# Patient Record
Sex: Male | Born: 1960 | Race: Black or African American | Hispanic: No | Marital: Married | Smoking: Never smoker
Health system: Southern US, Community
[De-identification: ages and names within clinical notes are randomized; demographics above are authoritative.]

## PROBLEM LIST (undated history)

## (undated) DIAGNOSIS — G4730 Sleep apnea, unspecified: Secondary | ICD-10-CM

## (undated) DIAGNOSIS — I10 Essential (primary) hypertension: Secondary | ICD-10-CM

## (undated) DIAGNOSIS — R0683 Snoring: Secondary | ICD-10-CM

## (undated) HISTORY — DX: Snoring: R06.83

## (undated) HISTORY — DX: Essential (primary) hypertension: I10

## (undated) HISTORY — DX: Sleep apnea, unspecified: G47.30

---

## 1997-11-23 ENCOUNTER — Emergency Department (HOSPITAL_COMMUNITY): Admission: EM | Admit: 1997-11-23 | Discharge: 1997-11-23 | Payer: Self-pay | Admitting: Emergency Medicine

## 2004-08-07 DIAGNOSIS — I10 Essential (primary) hypertension: Secondary | ICD-10-CM

## 2004-08-07 HISTORY — DX: Essential (primary) hypertension: I10

## 2010-08-18 ENCOUNTER — Emergency Department (HOSPITAL_COMMUNITY)
Admission: EM | Admit: 2010-08-18 | Discharge: 2010-08-18 | Payer: Self-pay | Source: Home / Self Care | Admitting: Emergency Medicine

## 2010-08-22 LAB — CK TOTAL AND CKMB (NOT AT ARMC)
CK, MB: 8.3 ng/mL (ref 0.3–4.0)
Relative Index: 0.9 (ref 0.0–2.5)
Total CK: 957 U/L — ABNORMAL HIGH (ref 7–232)

## 2010-08-22 LAB — TROPONIN I: Troponin I: 0.02 ng/mL (ref 0.00–0.06)

## 2010-09-01 ENCOUNTER — Ambulatory Visit (HOSPITAL_COMMUNITY)
Admission: RE | Admit: 2010-09-01 | Discharge: 2010-09-01 | Payer: Self-pay | Source: Home / Self Care | Attending: Internal Medicine | Admitting: Internal Medicine

## 2012-01-10 ENCOUNTER — Telehealth: Payer: Self-pay | Admitting: Gastroenterology

## 2012-01-10 NOTE — Telephone Encounter (Signed)
Pt was not scheduled. He had been referred by Dr. Sherwood Gambler and had receieved a letter from [place] to cal to schedule. I noted and faxed letter to PCP.

## 2012-01-10 NOTE — Telephone Encounter (Signed)
Pt called to cancel procedure and is going to have it done in [place].

## 2012-01-15 IMAGING — CR DG CHEST 2V
2 series · 2 of 2 positions shown · non-contrast
Comparison: 08/18/2010

CLINICAL DATA: Chest pain, mid chest to right side

CHEST - 2 VIEW

[view not recorded (1 of 2)]
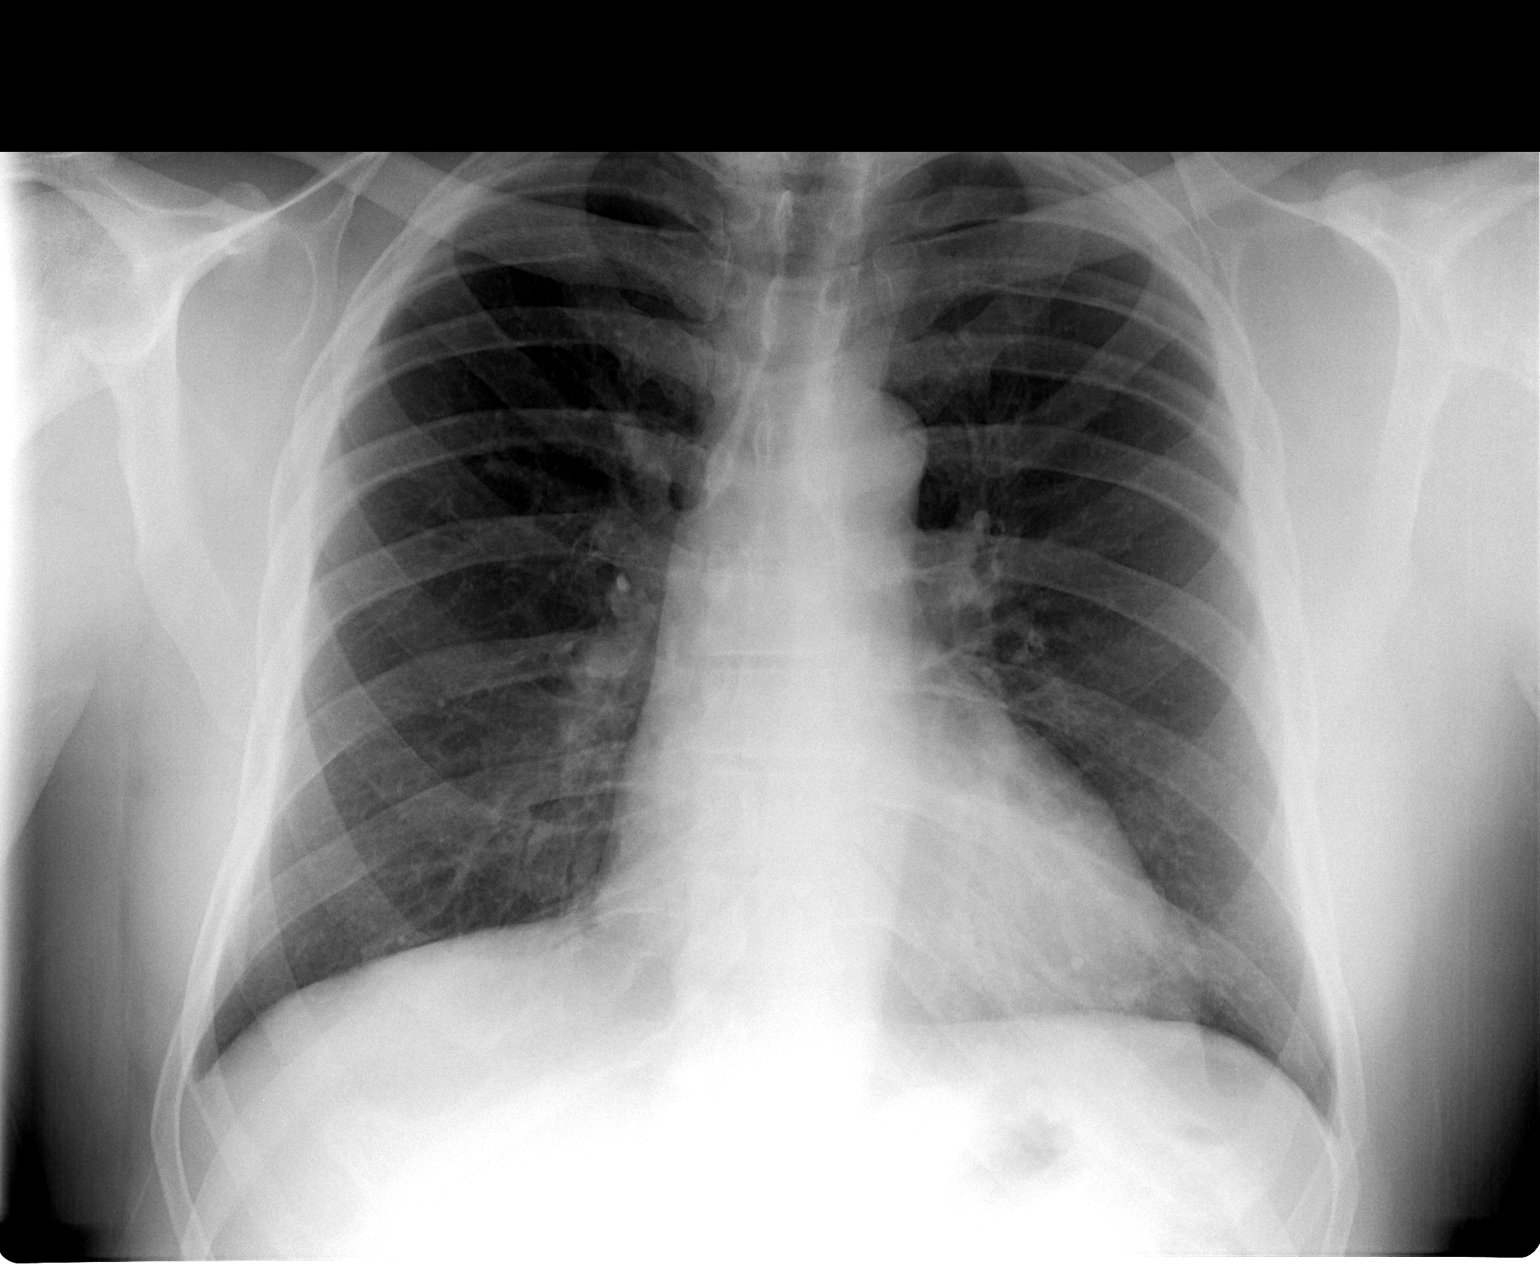

[view not recorded (2 of 2)]
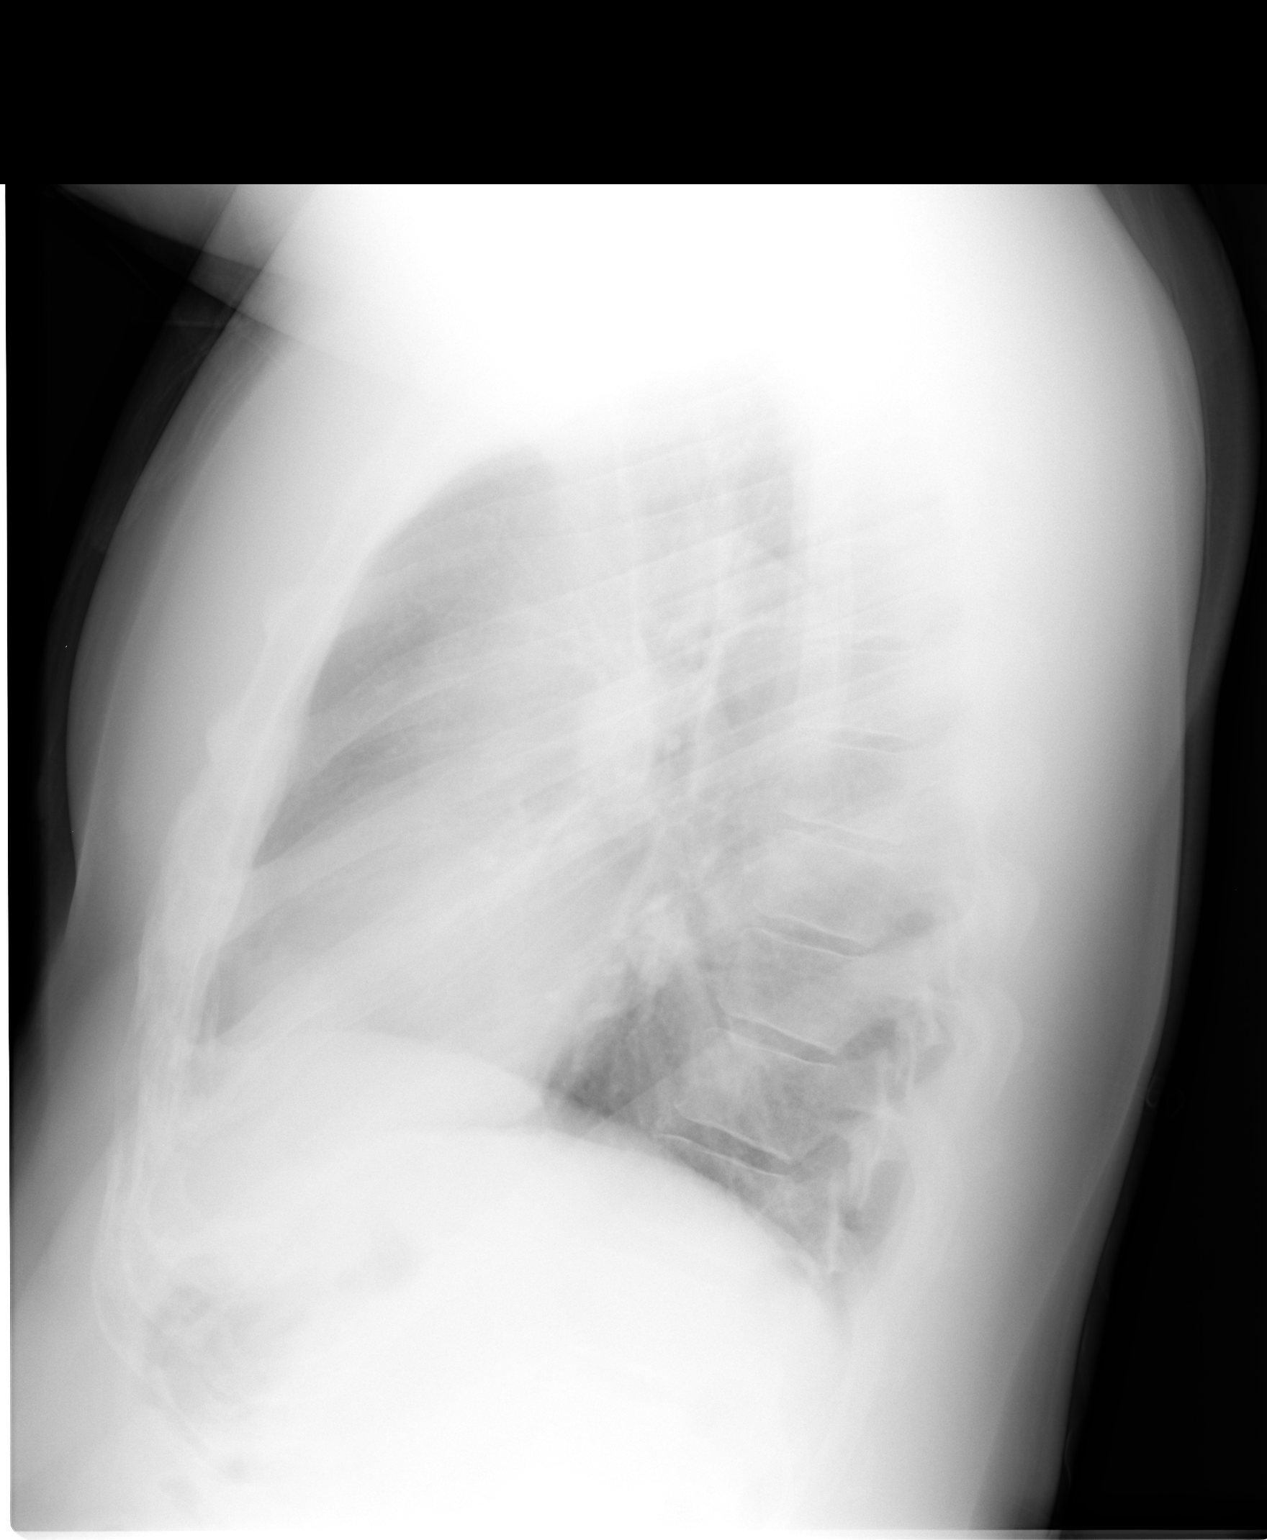

[2 of 2 positions shown; findings below may reference images not displayed]

FINDINGS: Borderline enlargement of cardiac silhouette.
Mediastinal contours and pulmonary vascularity normal.
Lungs clear.
No pleural effusion or pneumothorax.
No significant osseous abnormalities.
IMPRESSION: Borderline enlargement of cardiac silhouette.
No acute abnormalities.

## 2012-01-16 ENCOUNTER — Encounter: Payer: Self-pay | Admitting: Gastroenterology

## 2012-02-20 ENCOUNTER — Encounter: Payer: Self-pay | Admitting: Gastroenterology

## 2012-02-21 ENCOUNTER — Ambulatory Visit (AMBULATORY_SURGERY_CENTER): Payer: PRIVATE HEALTH INSURANCE

## 2012-02-21 VITALS — Ht 68.0 in | Wt 215.0 lb

## 2012-02-21 DIAGNOSIS — Z1211 Encounter for screening for malignant neoplasm of colon: Secondary | ICD-10-CM

## 2012-02-21 MED ORDER — MOVIPREP 100 G PO SOLR: 1.0000 | Freq: Once | ORAL | Status: DC

## 2012-03-05 ENCOUNTER — Encounter: Payer: Self-pay | Admitting: Gastroenterology

## 2012-03-05 ENCOUNTER — Ambulatory Visit (AMBULATORY_SURGERY_CENTER): Payer: PRIVATE HEALTH INSURANCE | Admitting: Gastroenterology

## 2012-03-05 VITALS — BP 174/73 | HR 56 | Temp 96.4°F | Resp 21 | Ht 68.0 in | Wt 215.0 lb

## 2012-03-05 DIAGNOSIS — Z1211 Encounter for screening for malignant neoplasm of colon: Secondary | ICD-10-CM

## 2012-03-05 MED ORDER — SODIUM CHLORIDE 0.9 % IV SOLN: 500.0000 mL | INTRAVENOUS | Status: DC

## 2012-03-05 NOTE — Op Note (Signed)
[facility] [address] [place], [place]  [address]  COLONOSCOPY PROCEDURE REPORT  PATIENT:  Dalton, Baker  MR#:  [id number] BIRTHDATE:  Jan 06, 1961, 51 yrs. old  GENDER:  male ENDOSCOPIST:  Barbette Hair. Arlyce Dice, MD REF. BY:  Assunta Found, M.D. PROCEDURE DATE:  03/05/2012 PROCEDURE:  Diagnostic Colonoscopy ASA CLASS:  Class I INDICATIONS:  Routine Risk Screening MEDICATIONS:   MAC sedation, administered by CRNA propofol 220mg IV  DESCRIPTION OF PROCEDURE:   After the risks benefits and alternatives of the procedure were thoroughly explained, informed consent was obtained.  Digital rectal exam was performed and revealed no abnormalities.   The LB [id number] endoscope was introduced through the anus and advanced to the cecum, which was identified by both the appendix and ileocecal valve, without limitations.  The quality of the prep was excellent, using MoviPrep.  The instrument was then slowly withdrawn as the colon was fully examined. <<PROCEDUREIMAGES>>  FINDINGS:  A normal appearing cecum, ileocecal valve, and appendiceal orifice were identified. The ascending, hepatic flexure, transverse, splenic flexure, descending, sigmoid colon, and rectum appeared unremarkable (see image1 and image2). Retroflexed views in the rectum revealed no abnormalities.    The time to cecum =  1) 2.0  minutes. The scope was then withdrawn in 1) 5.0  minutes from the cecum and the procedure completed. COMPLICATIONS:  None ENDOSCOPIC IMPRESSION: 1) Normal colon RECOMMENDATIONS: 1) Continue current colorectal screening recommendations for "routine risk" patients with a repeat colonoscopy in 10 years. REPEAT EXAM:  In 10 year(s) for Colonoscopy.  ______________________________ Barbette Hair. Arlyce Dice, MD  CC:  n. eSIGNED:   Barbette Hair. Kaplan at 03/05/2012 02:26 PM  Audria Nine, [phone]

## 2012-03-05 NOTE — Patient Instructions (Addendum)
Discharge instructions given with verbal understanding. Normal exam. Resume previous medications. YOU HAD AN ENDOSCOPIC PROCEDURE TODAY AT THE [facility]: Refer to the procedure report that was given to you for any specific questions about what was found during the examination.  If the procedure report does not answer your questions, please call your gastroenterologist to clarify.  If you requested that your care partner not be given the details of your procedure findings, then the procedure report has been included in a sealed envelope for you to review at your convenience later.  YOU SHOULD EXPECT: Some feelings of bloating in the abdomen. Passage of more gas than usual.  Walking can help get rid of the air that was put into your GI tract during the procedure and reduce the bloating. If you had a lower endoscopy (such as a colonoscopy or flexible sigmoidoscopy) you may notice spotting of blood in your stool or on the toilet paper. If you underwent a bowel prep for your procedure, then you may not have a normal bowel movement for a few days.  DIET: Your first meal following the procedure should be a light meal and then it is ok to progress to your normal diet.  A half-sandwich or bowl of soup is an example of a good first meal.  Heavy or fried foods are harder to digest and may make you feel nauseous or bloated.  Likewise meals heavy in dairy and vegetables can cause extra gas to form and this can also increase the bloating.  Drink plenty of fluids but you should avoid alcoholic beverages for 24 hours.  ACTIVITY: Your care partner should take you home directly after the procedure.  You should plan to take it easy, moving slowly for the rest of the day.  You can resume normal activity the day after the procedure however you should NOT DRIVE or use heavy machinery for 24 hours (because of the sedation medicines used during the test).    SYMPTOMS TO REPORT IMMEDIATELY: A gastroenterologist  can be reached at any hour.  During normal business hours, 8:30 AM to 5:00 PM Monday through Friday, call [phone].  After hours and on weekends, please call the GI answering service at [phone] who will take a message and have the physician on call contact you.   Following lower endoscopy (colonoscopy or flexible sigmoidoscopy):  Excessive amounts of blood in the stool  Significant tenderness or worsening of abdominal pains  Swelling of the abdomen that is new, acute  Fever of 100F or higher  FOLLOW UP: If any biopsies were taken you will be contacted by phone or by letter within the next 1-3 weeks.  Call your gastroenterologist if you have not heard about the biopsies in 3 weeks.  Our staff will call the home number listed on your records the next business day following your procedure to check on you and address any questions or concerns that you may have at that time regarding the information given to you following your procedure. This is a courtesy call and so if there is no answer at the home number and we have not heard from you through the emergency physician on call, we will assume that you have returned to your regular daily activities without incident.  SIGNATURES/CONFIDENTIALITY: You and/or your care partner have signed paperwork which will be entered into your electronic medical record.  These signatures attest to the fact that that the information above on your After Visit Summary has been reviewed   and is understood.  Full responsibility of the confidentiality of this discharge information lies with you and/or your care-partner. 

## 2012-03-05 NOTE — Progress Notes (Signed)
Patient did not experience any of the following events: a burn prior to discharge; a fall within the facility; wrong site/side/patient/procedure/implant event; or a hospital transfer or hospital admission upon discharge from the facility. (G8907) Patient did not have preoperative order for IV antibiotic SSI prophylaxis. (G8918)  

## 2012-03-05 NOTE — Progress Notes (Signed)
The pt tolerated the colonoscopy very well. Maw   

## 2012-03-06 ENCOUNTER — Telehealth: Payer: Self-pay | Admitting: *Deleted

## 2012-03-06 NOTE — Telephone Encounter (Signed)
  Follow up Call-  Call back number 03/05/2012  Post procedure Call Back phone  # [phone]  Permission to leave phone message Yes     Patient questions:  Do you have a fever, pain , or abdominal swelling? no Pain Score  0 *  Have you tolerated food without any problems? yes  Have you been able to return to your normal activities? yes  Do you have any questions about your discharge instructions: Diet   no Medications  no Follow up visit  no  Do you have questions or concerns about your Care? no  Actions: * If pain score is 4 or above: No action needed, pain <4.

## 2012-03-13 ENCOUNTER — Telehealth: Payer: Self-pay | Admitting: Gastroenterology

## 2012-03-13 NOTE — Telephone Encounter (Signed)
Report faxed per request

## 2014-03-05 ENCOUNTER — Encounter: Payer: Self-pay | Admitting: Neurology

## 2014-03-05 ENCOUNTER — Ambulatory Visit (INDEPENDENT_AMBULATORY_CARE_PROVIDER_SITE_OTHER): Payer: PRIVATE HEALTH INSURANCE | Admitting: Neurology

## 2014-03-05 VITALS — BP 110/60 | HR 72 | Resp 16 | Ht 67.25 in | Wt 210.0 lb

## 2014-03-05 DIAGNOSIS — R0683 Snoring: Secondary | ICD-10-CM | POA: Insufficient documentation

## 2014-03-05 DIAGNOSIS — R0989 Other specified symptoms and signs involving the circulatory and respiratory systems: Secondary | ICD-10-CM

## 2014-03-05 DIAGNOSIS — G4730 Sleep apnea, unspecified: Secondary | ICD-10-CM

## 2014-03-05 DIAGNOSIS — R0609 Other forms of dyspnea: Secondary | ICD-10-CM

## 2014-03-05 HISTORY — DX: Snoring: R06.83

## 2014-03-05 NOTE — Patient Instructions (Signed)
Sleep Apnea  Sleep apnea is a sleep disorder characterized by abnormal pauses in breathing while you sleep. When your breathing pauses, the level of oxygen in your blood decreases. This causes you to move out of deep sleep and into light sleep. As a result, your quality of sleep is poor, and the system that carries your blood throughout your body (cardiovascular system) experiences stress. If sleep apnea remains untreated, the following conditions can develop:  High blood pressure (hypertension).  Coronary artery disease.  Inability to achieve or maintain an erection (impotence).  Impairment of your thought process (cognitive dysfunction). There are three types of sleep apnea: 1. Obstructive sleep apnea--Pauses in breathing during sleep because of a blocked airway. 2. Central sleep apnea--Pauses in breathing during sleep because the area of the brain that controls your breathing does not send the correct signals to the muscles that control breathing. 3. Mixed sleep apnea--A combination of both obstructive and central sleep apnea. RISK FACTORS The following risk factors can increase your risk of developing sleep apnea:  Being overweight.  Smoking.  Having narrow passages in your nose and throat.  Being of older age.  Being male.  Alcohol use.  Sedative and tranquilizer use.  Ethnicity. Among individuals younger than 35 years, African Americans are at increased risk of sleep apnea. SYMPTOMS   Difficulty staying asleep.  Daytime sleepiness and fatigue.  Loss of energy.  Irritability.  Loud, heavy snoring.  Morning headaches.  Trouble concentrating.  Forgetfulness.  Decreased interest in sex. DIAGNOSIS  In order to diagnose sleep apnea, your caregiver will perform a physical examination. Your caregiver may suggest that you take a home sleep test. Your caregiver may also recommend that you spend the night in a sleep lab. In the sleep lab, several monitors record  information about your heart, lungs, and brain while you sleep. Your leg and arm movements and blood oxygen level are also recorded. TREATMENT The following actions may help to resolve mild sleep apnea:  Sleeping on your side.   Using a decongestant if you have nasal congestion.   Avoiding the use of depressants, including alcohol, sedatives, and narcotics.   Losing weight and modifying your diet if you are overweight. There also are devices and treatments to help open your airway:  Oral appliances. These are custom-made mouthpieces that shift your lower jaw forward and slightly open your bite. This opens your airway.  Devices that create positive airway pressure. This positive pressure "splints" your airway open to help you breathe better during sleep. The following devices create positive airway pressure:  Continuous positive airway pressure (CPAP) device. The CPAP device creates a continuous level of air pressure with an air pump. The air is delivered to your airway through a mask while you sleep. This continuous pressure keeps your airway open.  Nasal expiratory positive airway pressure (EPAP) device. The EPAP device creates positive air pressure as you exhale. The device consists of single-use valves, which are inserted into each nostril and held in place by adhesive. The valves create very little resistance when you inhale but create much more resistance when you exhale. That increased resistance creates the positive airway pressure. This positive pressure while you exhale keeps your airway open, making it easier to breath when you inhale again.  Bilevel positive airway pressure (BPAP) device. The BPAP device is used mainly in patients with central sleep apnea. This device is similar to the CPAP device because it also uses an air pump to deliver continuous air pressure   through a mask. However, with the BPAP machine, the pressure is set at two different levels. The pressure when you  exhale is lower than the pressure when you inhale.  Surgery. Typically, surgery is only done if you cannot comply with less invasive treatments or if the less invasive treatments do not improve your condition. Surgery involves removing excess tissue in your airway to create a wider passage way. Document Released: 07/14/2002 Document Revised: 11/18/2012 Document Reviewed: 11/30/2011 ExitCare Patient Information 2015 [employer]. This information is not intended to replace advice given to you by your health care provider. Make sure you discuss any questions you have with your health care provider.  

## 2014-03-05 NOTE — Progress Notes (Signed)
[facility]  Provider:  Melvyn Novas, [place] D  Referring Provider: Corrie Mckusick, MD Primary Care Physician:  Colette Ribas, MD  Chief Complaint  Patient presents with  . New Evaluation    Room 11  . 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 Phillips Odor for a sleep evaluation,  Dalton Baker reports today that his wife has been bothered by his increasingly loud snoring. The seems to have developed over several years and is not of sudden or very recent development. In addition he occasionally wakes up from a sensation of strangling of not getting any air in. His wife has noticed that his breathing can be irregular but she has not stated to him that he stopped breathing at night.  Dalton Baker has well-controlled hypertension on only one medication and he tends to sleep prone, on his belly , when he falls asleep at night. He sometimes has trouble sleeping through the night. When he wakes up with a strangling sensation, he usually finds himself on his back or supine. His bedtime is around 10.15 and he goes to sleep promptly, he wakes twice to go to the bathroom, he has had diaphoresis when not going to the bathroom in time. His bed time is based on his shift work, his work begins at 4 in AM to12 noon and he has a second job from [employer] - 6 PM. He than goes to the gymnasium.  He relies on an alarm to rise at 3 AM, and he wakes up with a dry mouth, sometimes with a headache. He drinks coffee in AM , about 2 -3 cups, he drinks sweetened iced tea though-out the day, and if eating out , will have a soda on occasion. He never drinks alcohol and does not use tobacco in any form.   He has no history of trauma or surgery to the neck and upper airway.   He was not aware of any childhood sleep problems, he was not excessively sleepy.      Review of  Systems: Out of a complete 14 system review, the patient complains of only the following symptoms, and all other reviewed systems are negative. FSS  6 and Epworth 7 .   History   Social History  . Marital Status: Married    Spouse Name: Dalton Baker    Number of Children: 2  . Years of Education: BS   Occupational History  .     Social History Main Topics  . Smoking status: Never Smoker   . Smokeless tobacco: Never Used  . Alcohol Use: No  . Drug Use: No  . Sexual Activity: Not on file   Other Topics Concern  . 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   Patient has two adult children.   Patient is working full-time.   Patient has a Bachelor's degree.   Patient is right-handed.   Patient drinks two cups of coffee and 1-2 cups of tea daily.    Family History  Problem Relation Age of Onset  . Colon cancer Neg Hx   . Multiple sclerosis Mother     Past Medical History  Diagnosis Date  . Hypertension 2006  . Snoring 03/05/2014    History reviewed. No pertinent past surgical  history.  Current Outpatient Prescriptions  Medication Sig Dispense Refill  . lisinopril-hydrochlorothiazide (PRINZIDE,ZESTORETIC) 20-25 MG per tablet Take 1 tablet by mouth daily.       No current facility-administered medications for this visit.    Allergies as of 03/05/2014  . (No Known Allergies)    Vitals: BP 110/60  Pulse 72  Resp 16  Ht 5' 7.25" (1.708 m)  Wt 210 lb (95.255 kg)  BMI 32.65 kg/m2 Last Weight:  Wt Readings from Last 1 Encounters:  03/05/14 210 lb (95.255 kg)   Last Height:   Ht Readings from Last 1 Encounters:  03/05/14 5' 7.25" (1.708 m)    Physical exam:  General: The patient is awake, alert and appears not in acute distress. The patient is well groomed. Head: Normocephalic, atraumatic. Neck is supple. Mallampati 2, neck circumference:18.5 inches, mild retrognathia.  Cardiovascular:  Regular  rate and rhythm , without  murmurs or carotid bruit, and without distended neck veins. Respiratory: Lungs are clear to auscultation. Skin:  Without evidence of edema, or rash Trunk: BMI is elevated ,patient  has normal posture.  Neurologic exam : The patient is awake and alert, oriented to place and time.  Memory subjective described as  . There is a normal attention span & concentration ability. Speech is fluent without  dysarthria, dysphonia or aphasia.  Mood and affect are appropriate.  Cranial nerves: Pupils are equal and briskly reactive to light. Funduscopic exam without  evidence of pallor or edema. Extraocular movements  in vertical and horizontal planes intact and without nystagmus. Visual fields by finger perimetry are intact. Hearing to finger rub intact.  Facial sensation intact to fine touch. Facial motor strength is symmetric and tongue and uvula move midline.  Motor exam:   Normal tone , muscle bulk and symmetric normal strength in all extremities.  Sensory:  Fine touch, pinprick and vibration were tested in all extremities.  Proprioception is tested in the upper extremities only. This was  normal.  Coordination: Finger-to-nose maneuver tested and normal without evidence of ataxia, dysmetria or tremor.  Gait and station: Patient walks without assistive device Deep tendon reflexes: in the  upper  Extremities, 1 plus, and lower extremities are attenuated, all symmetric.    Assessment:  After physical and neurologic examination, review of laboratory studies, imaging, neurophysiology testing and pre-existing records, assessment is   Risk factors for sleep apnea, overweight, retrognathia, narrow airway, clinical observation of snoring and crescendo breathing in supine. Nocturia nightly .  Plan:  Treatment plan and additional workup :  SPLIT at AHI 15 and score at 3%  ( medcost ). Co2 , if tolerated.  We discussed risk factors arising from untreated OSA, such as atrial fib,  cardiomyopathy and stroke.  We discussed risk factors for OSA, such  as BMI, retrognathia and supine sleep.  I advised of the tennisball methode.

## 2014-04-06 ENCOUNTER — Encounter: Payer: Self-pay | Admitting: Neurology

## 2014-04-06 ENCOUNTER — Ambulatory Visit (INDEPENDENT_AMBULATORY_CARE_PROVIDER_SITE_OTHER): Payer: PRIVATE HEALTH INSURANCE | Admitting: Neurology

## 2014-04-06 DIAGNOSIS — G4733 Obstructive sleep apnea (adult) (pediatric): Secondary | ICD-10-CM

## 2014-04-17 ENCOUNTER — Other Ambulatory Visit: Payer: Self-pay | Admitting: Neurology

## 2014-04-17 DIAGNOSIS — G4733 Obstructive sleep apnea (adult) (pediatric): Secondary | ICD-10-CM

## 2014-04-19 ENCOUNTER — Ambulatory Visit (INDEPENDENT_AMBULATORY_CARE_PROVIDER_SITE_OTHER): Payer: PRIVATE HEALTH INSURANCE

## 2014-04-19 DIAGNOSIS — G4733 Obstructive sleep apnea (adult) (pediatric): Secondary | ICD-10-CM

## 2014-04-29 ENCOUNTER — Telehealth: Payer: Self-pay | Admitting: *Deleted

## 2014-04-29 ENCOUNTER — Other Ambulatory Visit: Payer: Self-pay | Admitting: Neurology

## 2014-04-29 ENCOUNTER — Encounter: Payer: Self-pay | Admitting: Neurology

## 2014-04-29 DIAGNOSIS — G4733 Obstructive sleep apnea (adult) (pediatric): Secondary | ICD-10-CM

## 2014-04-29 NOTE — Telephone Encounter (Signed)
Patient was contacted and informed of his CPAP titration results.  Patient is very much in agreement in starting CPAP therapy at home.  Patient was informed that we are referring him to [place] for his DME provider given he lives in [place].  Patient was informed that a copy of his results would be sent to him and a copy was faxed to the referring doctor; Karleen Hampshire.  Patient was instructed on the importance of compliance to CPAP.  Patient informed that a follow up appointment would be scheduled 60-90 days after being put on CPAP therapy.

## 2014-06-08 ENCOUNTER — Encounter: Payer: Self-pay | Admitting: Neurology

## 2014-08-22 ENCOUNTER — Encounter: Payer: Self-pay | Admitting: Neurology

## 2019-10-18 ENCOUNTER — Other Ambulatory Visit: Payer: Self-pay

## 2019-10-18 ENCOUNTER — Ambulatory Visit: Payer: Self-pay | Attending: Internal Medicine

## 2019-10-18 DIAGNOSIS — Z23 Encounter for immunization: Secondary | ICD-10-CM

## 2019-10-18 NOTE — Progress Notes (Signed)
   Covid-19 Vaccination Clinic  Name:  Dalton Baker    MRN: [id number] DOB: 1961/05/03  10/18/2019  [name] was observed post Covid-19 immunization for 15 minutes without incident. He was provided with Vaccine Information Sheet and instruction to access the V-Safe system.   [name] was instructed to call 911 with any severe reactions post vaccine: Marland Kitchen Difficulty breathing  . Swelling of face and throat  . A fast heartbeat  . A bad rash all over body  . Dizziness and weakness   Immunizations Administered    Name Date Dose VIS Date Route   Moderna COVID-19 Vaccine 10/18/2019  9:49 AM 0.5 mL 07/08/2019 Intramuscular   Manufacturer: Moderna   Lot: [id number]   NDC: 83672-550-01

## 2019-11-01 ENCOUNTER — Ambulatory Visit: Payer: Self-pay

## 2019-11-19 ENCOUNTER — Ambulatory Visit: Payer: Self-pay | Attending: Internal Medicine

## 2019-11-19 DIAGNOSIS — Z23 Encounter for immunization: Secondary | ICD-10-CM

## 2019-11-19 NOTE — Progress Notes (Signed)
   Covid-19 Vaccination Clinic  Name:  Dalton Baker    MRN: [id number] DOB: 1961-01-26  11/19/2019  Mr. Culley was observed post Covid-19 immunization for 15 minutes without incident. He was provided with Vaccine Information Sheet and instruction to access the V-Safe system.   Mr. Gloria was instructed to call 911 with any severe reactions post vaccine: Marland Kitchen Difficulty breathing  . Swelling of face and throat  . A fast heartbeat  . A bad rash all over body  . Dizziness and weakness   Immunizations Administered    Name Date Dose VIS Date Route   Moderna COVID-19 Vaccine 11/19/2019  8:59 AM 0.5 mL 07/08/2019 Intramuscular   Manufacturer: Moderna   Lot: [id number]   NDC: 19941-290-47

## 2020-03-25 ENCOUNTER — Encounter: Payer: Self-pay | Admitting: Emergency Medicine

## 2020-03-25 ENCOUNTER — Other Ambulatory Visit: Payer: Self-pay

## 2020-03-25 ENCOUNTER — Ambulatory Visit
Admission: EM | Admit: 2020-03-25 | Discharge: 2020-03-25 | Disposition: A | Discharge status: Home / Self Care | Payer: PRIVATE HEALTH INSURANCE | Attending: Emergency Medicine | Admitting: Emergency Medicine

## 2020-03-25 ENCOUNTER — Ambulatory Visit (INDEPENDENT_AMBULATORY_CARE_PROVIDER_SITE_OTHER): Payer: PRIVATE HEALTH INSURANCE

## 2020-03-25 DIAGNOSIS — M25562 Pain in left knee: Secondary | ICD-10-CM

## 2020-03-25 NOTE — ED Provider Notes (Signed)
[facility]   [id number] 03/25/20 Arrival Time: 2595   Chief Complaint  Patient presents with  . Knee Pain     SUBJECTIVE: History from: patient.  Dalton Baker is a 59 y.o. male who presented to the urgent care with a complaint of left knee pain for the past 1 week.  Reported sleep and heating on a hard surface.  He localizes the pain to the left knee.  He describes the pain as constant and achy.  He has tried OTC medications without relief.  His symptoms are made worse with ROM.  He denies similar symptoms in the past.  Denies chills, fever, nausea, vomiting, diarrhea.      ROS: As per HPI.  All other pertinent ROS negative.      Past Medical History:  Diagnosis Date  . Hypertension 2006  . Snoring 03/05/2014   History reviewed. No pertinent surgical history. No Known Allergies No current facility-administered medications on file prior to encounter.   Current Outpatient Medications on File Prior to Encounter  Medication Sig Dispense Refill  . lisinopril-hydrochlorothiazide (PRINZIDE,ZESTORETIC) 20-25 MG per tablet Take 1 tablet by mouth daily.     Social History   Socioeconomic History  . Marital status: Married    Spouse name: Clarassia  . Number of children: 2  . Years of education: BS  . Highest education level: Not on file  Occupational History    Employer: [employer]  Tobacco Use  . Smoking status: Never Smoker  . Smokeless tobacco: Never Used  Substance and Sexual Activity  . Alcohol use: No  . Drug use: No  . Sexual activity: Not on file  Other Topics Concern  . Not on file  Social History Narrative   Patient is married Aeronautical engineer) and lives at home with his wife.   Patient has two adult children.   Patient is working full-time.   Patient has a Bachelor's degree.   Patient is right-handed.   Patient drinks two cups of coffee and 1-2 cups of tea daily.   Social Determinants of Health   Financial Resource Strain:   .  Difficulty of Paying Living Expenses: Not on file  Food Insecurity:   . Worried About Programme researcher, broadcasting/film/video in the Last Year: Not on file  . Ran Out of Food in the Last Year: Not on file  Transportation Needs:   . Lack of Transportation (Medical): Not on file  . Lack of Transportation (Non-Medical): Not on file  Physical Activity:   . Days of Exercise per Week: Not on file  . Minutes of Exercise per Session: Not on file  Stress:   . Feeling of Stress : Not on file  Social Connections:   . Frequency of Communication with Friends and Family: Not on file  . Frequency of Social Gatherings with Friends and Family: Not on file  . Attends Religious Services: Not on file  . Active Member of Clubs or Organizations: Not on file  . Attends Banker Meetings: Not on file  . Marital Status: Not on file  Intimate Partner Violence:   . Fear of Current or Ex-Partner: Not on file  . Emotionally Abused: Not on file  . Physically Abused: Not on file  . Sexually Abused: Not on file   Family History  Problem Relation Age of Onset  . Multiple sclerosis Mother   . Colon cancer Neg Hx     OBJECTIVE:  Vitals:   03/25/20 6387 03/25/20 5643  BP:  (!) 179/82  Pulse:  62  Resp:  18  Temp:  98.1 F (36.7 C)  TempSrc:  Oral  SpO2:  96%  Weight: 195 lb (88.5 kg)   Height: 5\' 8"  (1.727 m)      Physical Exam Vitals and nursing note reviewed.  Constitutional:      General: He is not in acute distress.    Appearance: Normal appearance. He is normal weight. He is not ill-appearing, toxic-appearing or diaphoretic.  Cardiovascular:     Rate and Rhythm: Normal rate and regular rhythm.     Pulses: Normal pulses.     Heart sounds: Normal heart sounds. No murmur heard.  No friction rub. No gallop.   Pulmonary:     Effort: Pulmonary effort is normal. No respiratory distress.     Breath sounds: Normal breath sounds. No stridor. No wheezing, rhonchi or rales.  Chest:     Chest wall: No  tenderness.  Musculoskeletal:        General: Tenderness present.     Right knee: Normal.     Left knee: Tenderness present.     Comments: Patient is able to bear weight and ambulate with pain.  No surface trauma, obvious effusion, overlying erythema or warmth.  The left knee is without obvious asymmetry or deformity when compared to the right knee.  Limited range of motion due to pain.  Tenderness to lateral aspect of left knee.  No quadricep tenderness.  Active anterior drawer test  Neurological:     Mental Status: He is alert and oriented to person, place, and time.     LABS:  No results found for this or any previous visit (from the past 24 hour(s)).   RADIOLOGY  DG Knee Complete 4 Views Left  Result Date: 03/25/2020 CLINICAL DATA:  Knee pain. EXAM: LEFT KNEE - COMPLETE 4+ VIEW COMPARISON:  No prior. FINDINGS: Mild tricompartment degenerative change. No acute bony or joint abnormality. No evidence of fracture or dislocation. Tiny loose bodies cannot be excluded. No prominent effusion. Peripheral vascular calcification. IMPRESSION: Mild tricompartment degenerative change. Tiny loose bodies cannot be excluded. No acute bony abnormality identified. Electronically Signed   By: 03/27/2020  Register   On: 03/25/2020 09:11     ASSESSMENT & PLAN:  1. Acute pain of left knee     No orders of the defined types were placed in this encounter.  Patient is stable at discharge.  Left knee x-ray is negative for bony abnormality including fracture or dislocation.  I have reviewed the x-ray myself and the radiologist interpretation.  I am in agreement with the radiologist interpretation.  Discharge Instructions   Take OTC Tylenol/ibuprofen as needed for pain Follow RICE instruction that is attached Continue to use knee sleeve for support support Follow-up with PCP/orthopedic Return or go to ED for worsening of symptoms  Reviewed expectations re: course of current medical issues. Questions  answered. Outlined signs and symptoms indicating need for more acute intervention. Patient verbalized understanding. After Visit Summary given.      Note: This document was prepared using Dragon voice recognition software and may include unintentional dictation errors.    03/27/2020, FNP 03/25/20 [phone]

## 2020-03-25 NOTE — Discharge Instructions (Addendum)
Take OTC Tylenol/ibuprofen as needed for pain Follow RICE instruction that is attached Continue to use knee sleeve for support support Follow-up with PCP/orthopedic Return or go to ED for worsening of symptoms

## 2020-03-25 NOTE — ED Triage Notes (Signed)
Pain to LT knee x 1 week after slipping after hitting it on a rafter. Pt is ambulatory but is limping. Wearing a knee sleeve.

## 2020-09-15 ENCOUNTER — Encounter: Payer: Self-pay | Admitting: Orthopaedic Surgery

## 2020-09-15 ENCOUNTER — Ambulatory Visit: Payer: Self-pay

## 2020-09-15 ENCOUNTER — Other Ambulatory Visit: Payer: Self-pay

## 2020-09-15 ENCOUNTER — Ambulatory Visit (INDEPENDENT_AMBULATORY_CARE_PROVIDER_SITE_OTHER): Payer: PRIVATE HEALTH INSURANCE | Admitting: Orthopaedic Surgery

## 2020-09-15 VITALS — Ht 68.0 in | Wt 195.0 lb

## 2020-09-15 DIAGNOSIS — G8929 Other chronic pain: Secondary | ICD-10-CM | POA: Diagnosis not present

## 2020-09-15 DIAGNOSIS — M25562 Pain in left knee: Secondary | ICD-10-CM | POA: Diagnosis not present

## 2020-09-15 DIAGNOSIS — M1712 Unilateral primary osteoarthritis, left knee: Secondary | ICD-10-CM | POA: Insufficient documentation

## 2020-09-15 NOTE — Progress Notes (Signed)
Office Visit Note   Patient: DELDRICK Baker           Date of Birth: 11/05/1960           MRN: [id number] Visit Date: 09/15/2020              Requested by: Assunta Found, MD [address] [place],  [place] [address] PCP: Assunta Found, MD   Assessment & Plan: Visit Diagnoses:  1. Chronic pain of left knee   2. Unilateral primary osteoarthritis, left knee     Plan: Recent films were reviewed on the PACS system.  There is evidence of medial compartment arthritis with very slight narrowing and some peripheral osteophytes.  I believe that is the cause of his pain.  Long discussion regarding exercises and over-the-counter medicines.  At some point in the future would consider cortisone injection  Follow-Up Instructions: No follow-ups on file.   Orders:  No orders of the defined types were placed in this encounter.  No orders of the defined types were placed in this encounter.     Procedures: No procedures performed   Clinical Data: No additional findings.   Subjective: Chief Complaint  Patient presents with  . Left Knee - Pain  Patient presents today for left knee pain. He said that it started to hurt about 3 weeks ago. No known injury. His pain is located medially. No grinding or giving way. 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 He has been wearing knee pads at work and that helps. He has tried Set designer, along with over the counter oral meds. No previous left knee surgery. Patient states that he had this same knee pain back in August of 2021 and had x-rays. The pain went away and has came back.  X-rays reviewed  on the PACS system.  Mild narrowing of the medial joint space associated with very small peripheral osteophytes.  Films were consistent with osteoarthritis  HPI  Review of Systems   Objective: Vital Signs: Ht 5\' 8"  (1.727 m)   Wt 195 lb (88.5 kg)   BMI 29.65 kg/m   Physical Exam Constitutional:      Appearance: He is  well-developed and well-nourished.  HENT:     Mouth/Throat:     Mouth: Oropharynx is clear and moist.  Eyes:     Extraocular Movements: EOM normal.     Pupils: Pupils are equal, round, and reactive to light.  Pulmonary:     Effort: Pulmonary effort is normal.  Skin:    General: Skin is warm and dry.  Neurological:     Mental Status: He is alert and oriented to person, place, and time.  Psychiatric:        Mood and Affect: Mood and affect normal.        Behavior: Behavior normal.     Ortho Exam knee with diffuse medial joint mild tenderness.  No effusion.  Full extension and flexion over 100 degrees without instability.  No popliteal mass.  No calf pain.  No distal edema.  Neurologically intact.  No popping or clicking Specialty Comments:  No specialty comments available.  Imaging: No results found.   PMFS History: Patient Active Problem List   Diagnosis Date Noted  . Unilateral primary osteoarthritis, left knee 09/15/2020  . Snoring 03/05/2014  . Unspecified sleep apnea 03/05/2014   Past Medical History:  Diagnosis Date  . Hypertension 2006  . Snoring 03/05/2014    Family History  Problem  Relation Age of Onset  . Multiple sclerosis Mother   . Colon cancer Neg Hx     History reviewed. No pertinent surgical history. Social History   Occupational History    Employer: [employer]  Tobacco Use  . Smoking status: Never Smoker  . Smokeless tobacco: Never Used  Substance and Sexual Activity  . Alcohol use: No  . Drug use: No  . Sexual activity: Not on file

## 2020-09-29 ENCOUNTER — Other Ambulatory Visit: Payer: Self-pay

## 2020-09-29 ENCOUNTER — Ambulatory Visit (INDEPENDENT_AMBULATORY_CARE_PROVIDER_SITE_OTHER): Payer: PRIVATE HEALTH INSURANCE | Admitting: Orthopaedic Surgery

## 2020-09-29 ENCOUNTER — Encounter: Payer: Self-pay | Admitting: Orthopaedic Surgery

## 2020-09-29 VITALS — Ht 68.0 in | Wt 195.0 lb

## 2020-09-29 DIAGNOSIS — M1712 Unilateral primary osteoarthritis, left knee: Secondary | ICD-10-CM | POA: Diagnosis not present

## 2020-09-29 MED ORDER — BUPIVACAINE HCL 0.25 % IJ SOLN
2.0000 mL | INTRAMUSCULAR | Status: AC | PRN
  Administered 2020-09-29: 2 mL via INTRA_ARTICULAR

## 2020-09-29 NOTE — Progress Notes (Signed)
Office Visit Note   Patient: Dalton Baker           Date of Birth: 06-20-1961           MRN: [id number] Visit Date: 09/29/2020              Requested by: Assunta Found, MD [address] [place],  [place] [address] PCP: Assunta Found, MD   Assessment & Plan: Visit Diagnoses:  1. Unilateral primary osteoarthritis, left knee     Plan: To Romeo Apple was seen several weeks ago for evaluation of left knee pain.  Films demonstrated significant osteoarthritis.  He returns today for cortisone injection  Follow-Up Instructions: Return if symptoms worsen or fail to improve.   Orders:  Orders Placed This Encounter  Procedures  . Large Joint Inj: L knee   No orders of the defined types were placed in this encounter.     Procedures: Large Joint Inj: L knee on 09/29/2020 10:04 AM Indications: pain and diagnostic evaluation Details: 25 G 1.5 in needle, anteromedial approach  Arthrogram: No  Medications: 2 mL bupivacaine 0.25 %  12 mg betamethasone injected into the medial compartment left knee with the Marcaine Procedure, treatment alternatives, risks and benefits explained, specific risks discussed. Consent was given by the patient. Patient was prepped and draped in the usual sterile fashion.       Clinical Data: No additional findings.   Subjective: Chief Complaint  Patient presents with  . Left Knee - Pain, Follow-up  Patient presents today for follow up on his left knee. He continues to have pain and comes back today for a cortisone injection. He takes Aleve as needed.   HPI  Review of Systems   Objective: Vital Signs: Ht 5\' 8"  (1.727 m)   Wt 195 lb (88.5 kg)   BMI 29.65 kg/m   Physical Exam Constitutional:      Appearance: He is well-developed and well-nourished.  HENT:     Mouth/Throat:     Mouth: Oropharynx is clear and moist.  Eyes:     Extraocular Movements: EOM normal.     Pupils: Pupils are equal, round, and reactive to light.  Pulmonary:      Effort: Pulmonary effort is normal.  Skin:    General: Skin is warm and dry.  Neurological:     Mental Status: He is alert and oriented to person, place, and time.  Psychiatric:        Mood and Affect: Mood and affect normal.        Behavior: Behavior normal.     Ortho Exam left knee with predominant medial joint pain.  No effusion.  Full extension.  He has an obvious limp with weightbearing skin changes.  No in stability Specialty Comments:  No specialty comments available.  Imaging: No results found.   PMFS History: Patient Active Problem List   Diagnosis Date Noted  . Unilateral primary osteoarthritis, left knee 09/15/2020  . Snoring 03/05/2014  . Unspecified sleep apnea 03/05/2014   Past Medical History:  Diagnosis Date  . Hypertension 2006  . Snoring 03/05/2014    Family History  Problem Relation Age of Onset  . Multiple sclerosis Mother   . Colon cancer Neg Hx     History reviewed. No pertinent surgical history. Social History   Occupational History    Employer: [employer]  Tobacco Use  . Smoking status: Never Smoker  . Smokeless tobacco: Never Used  Substance and Sexual Activity  . Alcohol use:  No  . Drug use: No  . Sexual activity: Not on file

## 2020-11-17 ENCOUNTER — Encounter: Payer: Self-pay | Admitting: Orthopaedic Surgery

## 2020-11-17 ENCOUNTER — Other Ambulatory Visit: Payer: Self-pay

## 2020-11-17 ENCOUNTER — Ambulatory Visit (INDEPENDENT_AMBULATORY_CARE_PROVIDER_SITE_OTHER): Payer: PRIVATE HEALTH INSURANCE | Admitting: Orthopaedic Surgery

## 2020-11-17 DIAGNOSIS — M25462 Effusion, left knee: Secondary | ICD-10-CM | POA: Diagnosis not present

## 2020-11-17 NOTE — Progress Notes (Signed)
Office Visit Note   Patient: Dalton Baker           Date of Birth: 04/02/1961           MRN: [id number] Visit Date: 11/17/2020              Requested by: Assunta Found, MD [address] [place],  [place] [address] PCP: Assunta Found, MD   Assessment & Plan: Visit Diagnoses:  1. Effusion of prepatellar bursa, left     Plan: Prepatellar bursa left knee.  The bursal sac is about a centimeter half in diameter.  There is no redness or pain and easily mobile.  Mr. Troy Sine does do some work on his knee is not sure that is the cause of it.  At this point there is no need to treat as it is small and not symptomatic.  He will not wear kneepads.  At some point the future he may be a candidate to aspirate and inject with cortisone.  He is not having any significant knee pain from the arthritis after the injection several months ago  Follow-Up Instructions: Return if symptoms worsen or fail to improve.   Orders:  No orders of the defined types were placed in this encounter.  No orders of the defined types were placed in this encounter.     Procedures: No procedures performed   Clinical Data: No additional findings.   Subjective: Chief Complaint  Patient presents with  . Left Knee - Follow-up, Pain  Patient presents today for his left knee. He was here in February and received a cortisone injection. He states that he has no pain, but noticed a knot on the front of his about three weeks ago. No injury. No pain in his knee.   HPI  Review of Systems   Objective: Vital Signs: Ht 5\' 8"  (1.727 m)   Wt 195 lb (88.5 kg)   BMI 29.65 kg/m   Physical Exam Constitutional:      Appearance: He is well-developed.  Eyes:     Pupils: Pupils are equal, round, and reactive to light.  Pulmonary:     Effort: Pulmonary effort is normal.  Skin:    General: Skin is warm and dry.  Neurological:     Mental Status: He is alert and oriented to person, place, and time.  Psychiatric:         Behavior: Behavior normal.     Ortho Exam awake alert and oriented x3.  Comfortable sitting.  Left knee without effusion and no significant medial lateral joint pain.  Full extension and flexion without instability.  There is a very small centimeter and a half freely mobile cystic structure along the inferior patella consistent with prepatellar bursa.  There is no redness or pain Specialty Comments:  No specialty comments available.  Imaging: No results found.   PMFS History: Patient Active Problem List   Diagnosis Date Noted  . Effusion of prepatellar bursa, left 11/17/2020  . Unilateral primary osteoarthritis, left knee 09/15/2020  . Snoring 03/05/2014  . Unspecified sleep apnea 03/05/2014   Past Medical History:  Diagnosis Date  . Hypertension 2006  . Snoring 03/05/2014    Family History  Problem Relation Age of Onset  . Multiple sclerosis Mother   . Colon cancer Neg Hx     History reviewed. No pertinent surgical history. Social History   Occupational History    Employer: [employer]  Tobacco Use  . Smoking status: Never Smoker  .  Smokeless tobacco: Never Used  Substance and Sexual Activity  . Alcohol use: No  . Drug use: No  . Sexual activity: Not on file

## 2021-08-08 IMAGING — DX DG KNEE COMPLETE 4+V*L*
4 series · 4 of 4 positions shown · non-contrast
Comparison: No prior.

CLINICAL DATA: Knee pain.

EXAM:
LEFT KNEE - COMPLETE 4+ VIEW

[knee ap]
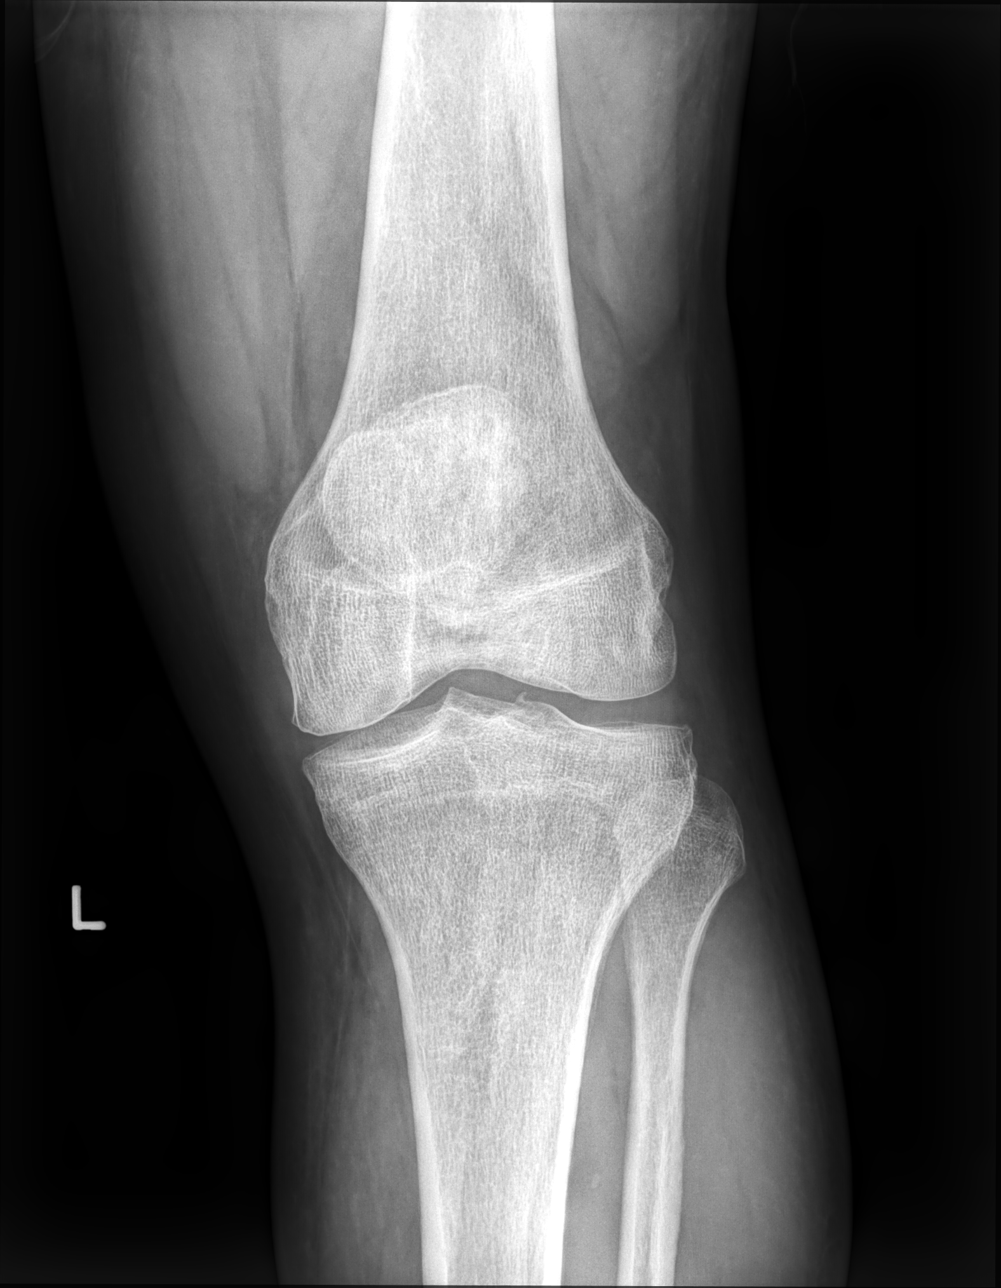

[knee mlo (1 of 2)]
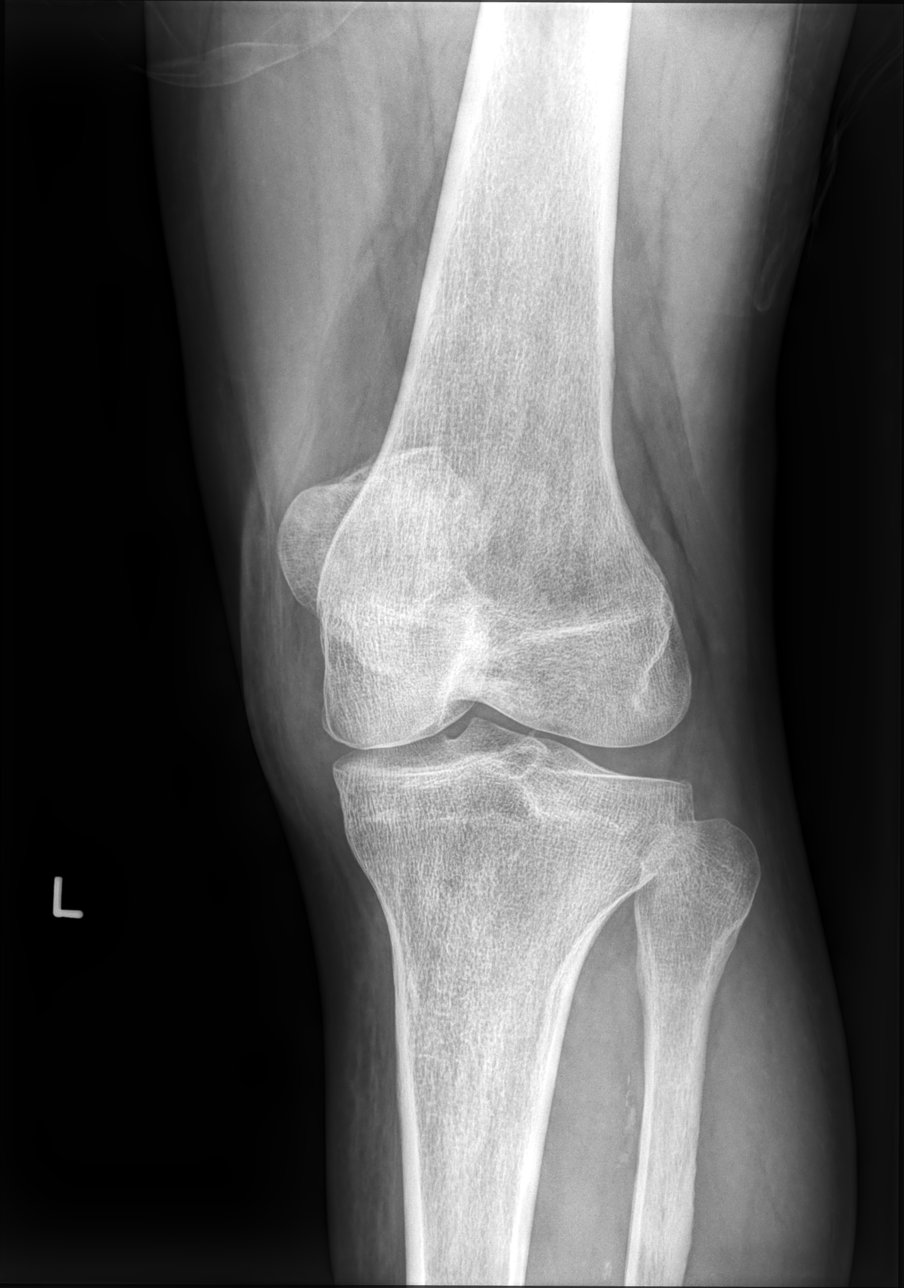

[knee mlo (2 of 2)]
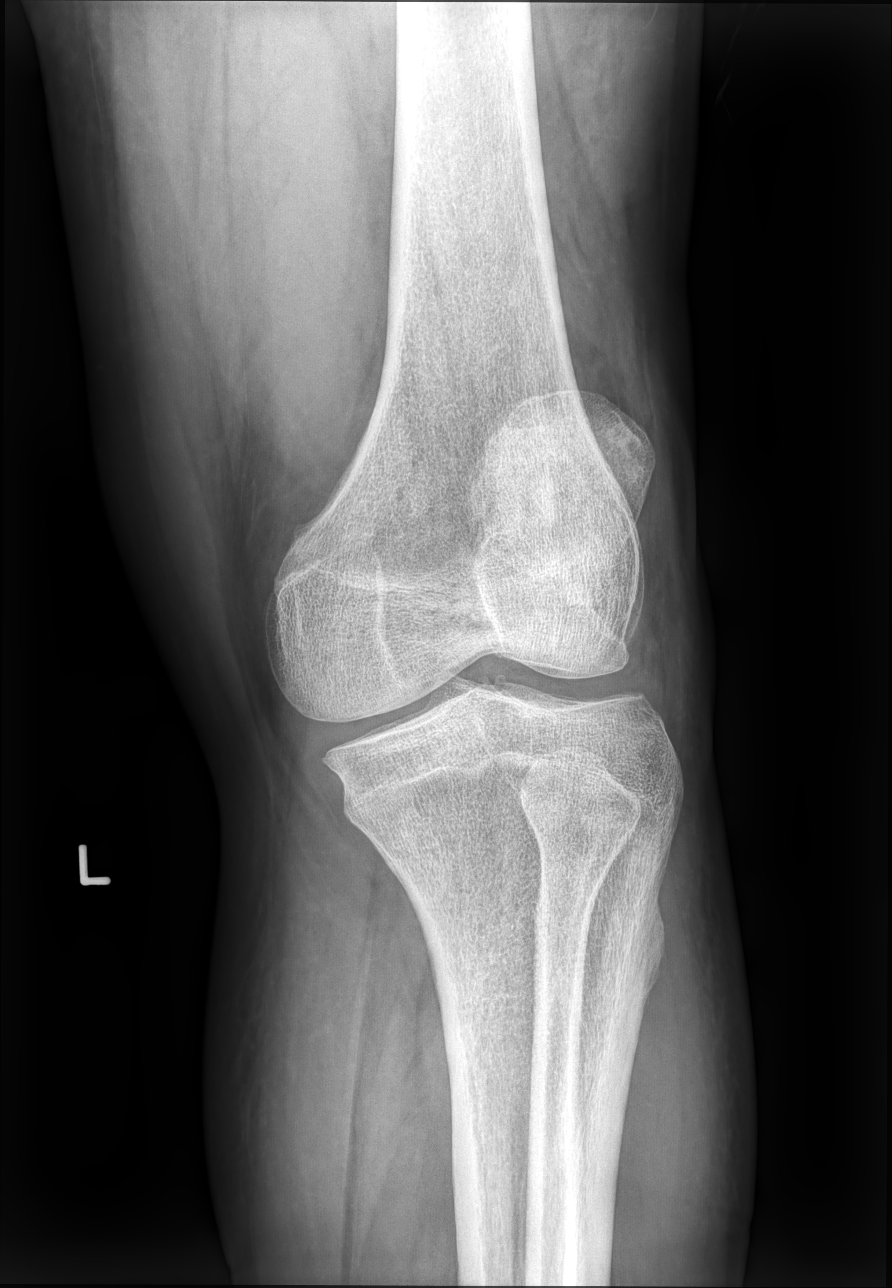

[knee lat]
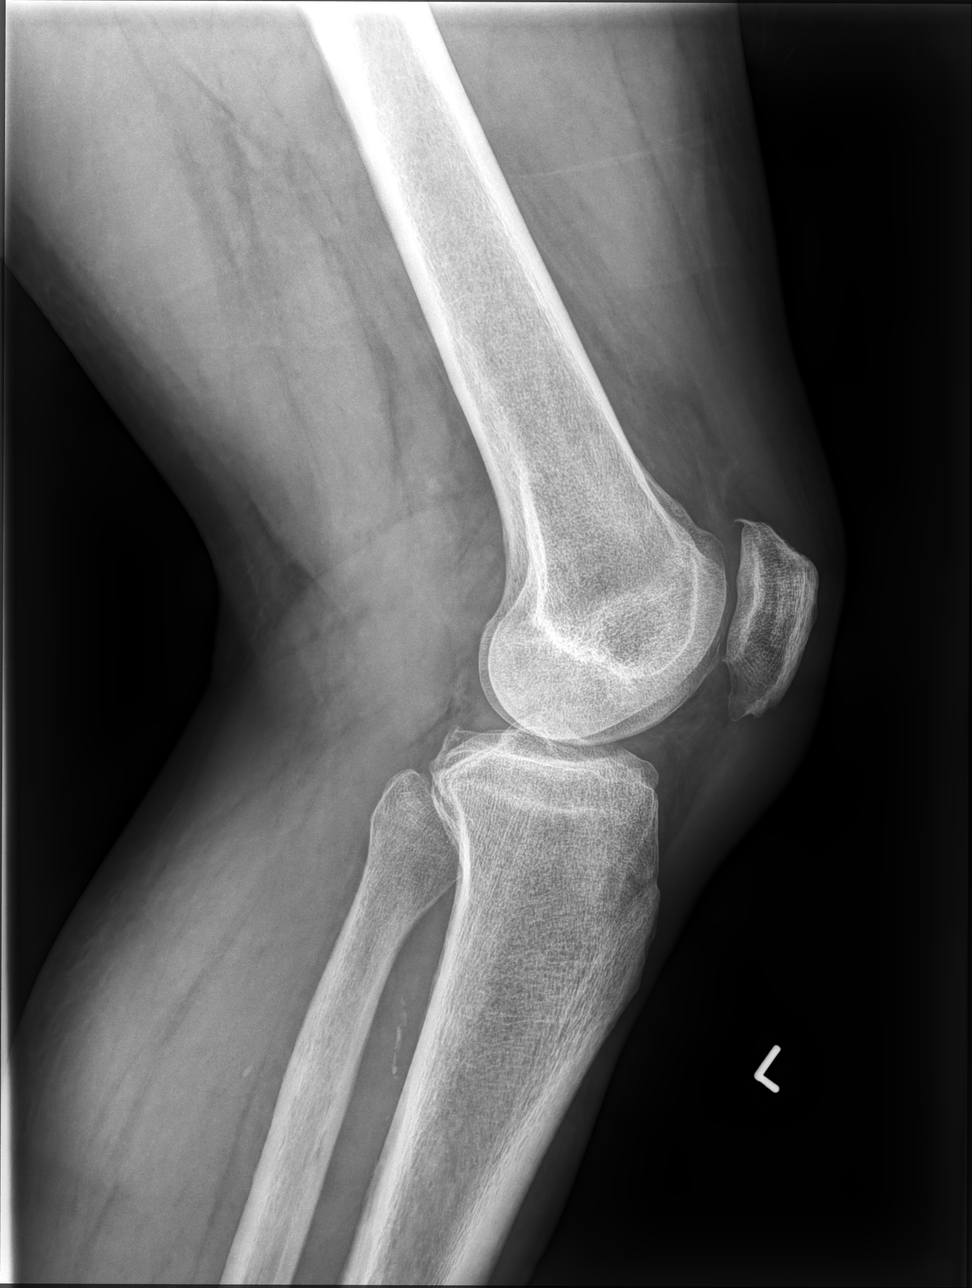

[4 of 4 positions shown; findings below may reference images not displayed]

FINDINGS: Mild tricompartment degenerative change. No acute bony or joint
abnormality. No evidence of fracture or dislocation. Tiny loose
bodies cannot be excluded. No prominent effusion. Peripheral
vascular calcification.
IMPRESSION: Mild tricompartment degenerative change. Tiny loose bodies cannot be
excluded. No acute bony abnormality identified.

## 2021-10-17 ENCOUNTER — Encounter (INDEPENDENT_AMBULATORY_CARE_PROVIDER_SITE_OTHER): Payer: Self-pay | Admitting: *Deleted

## 2022-01-25 ENCOUNTER — Encounter: Payer: Self-pay | Admitting: Gastroenterology

## 2022-02-13 ENCOUNTER — Ambulatory Visit (AMBULATORY_SURGERY_CENTER): Payer: Self-pay | Admitting: *Deleted

## 2022-02-13 VITALS — Ht 67.5 in | Wt 201.0 lb

## 2022-02-13 DIAGNOSIS — Z1211 Encounter for screening for malignant neoplasm of colon: Secondary | ICD-10-CM

## 2022-02-13 MED ORDER — NA SULFATE-K SULFATE-MG SULF 17.5-3.13-1.6 GM/177ML PO SOLN: 2.0000 | Freq: Once | ORAL | 0 refills | Status: AC

## 2022-02-13 NOTE — Progress Notes (Signed)
No egg or soy allergy known to patient  No issues known to pt with past sedation with any surgeries or procedures Patient denies ever being told they had issues or difficulty with intubation  No FH of Malignant Hyperthermia Pt is not on diet pills Pt is not on  home 02  Pt is not on blood thinners  Pt denies issues with constipation  No A fib or A flutter   Discussed with pt there will be an out-of-pocket cost for prep and that varies from $0 to 70 +  dollars - pt verbalized understanding    PV completed in person. Pt verified name, DOB.  Procedure explained to pt. Prep instructions reviewed, questions answered. Pt encouraged to call with questions or issues.  If pt has My chart, procedure instructions sent via My Chart    

## 2022-03-13 ENCOUNTER — Encounter: Payer: PRIVATE HEALTH INSURANCE | Admitting: Gastroenterology

## 2022-04-05 ENCOUNTER — Encounter: Payer: Self-pay | Admitting: Certified Registered Nurse Anesthetist

## 2022-04-11 ENCOUNTER — Telehealth: Payer: Self-pay

## 2022-04-11 NOTE — Telephone Encounter (Signed)
This patient has a colonoscopy tomorrow, and I see that he was in the ED last week for asymptomatic high blood pressure.  Please contact him to make sure he takes his blood pressure medicines tomorrow morning with a sip of water about 3 hours prior to his procedure (and after completing the bowel preparation).

## 2022-04-11 NOTE — Telephone Encounter (Signed)
Called and spoke with patient. Pt has been advised to take his BP medications tomorrow at 6:30 am. Pt knows that his BP will be checked upon arrival. Pt verbalized understanding and had no concerns at the end of the call.

## 2022-04-12 ENCOUNTER — Ambulatory Visit (AMBULATORY_SURGERY_CENTER): Payer: PRIVATE HEALTH INSURANCE | Admitting: Gastroenterology

## 2022-04-12 ENCOUNTER — Encounter: Payer: Self-pay | Admitting: Gastroenterology

## 2022-04-12 VITALS — BP 110/54 | HR 48 | Temp 98.4°F | Resp 12 | Ht 68.0 in | Wt 201.0 lb

## 2022-04-12 DIAGNOSIS — Z1211 Encounter for screening for malignant neoplasm of colon: Secondary | ICD-10-CM | POA: Diagnosis present

## 2022-04-12 MED ORDER — SODIUM CHLORIDE 0.9 % IV SOLN: 500.0000 mL | Freq: Once | INTRAVENOUS | Status: DC

## 2022-04-12 NOTE — Progress Notes (Signed)
Report given to PACU, vss 

## 2022-04-12 NOTE — Progress Notes (Signed)
History and Physical:  This patient presents for endoscopic testing for: Encounter Diagnosis  Name Primary?   Special screening for malignant neoplasms, colon Yes    No polyps last colonoscopy July 2013 Patient denies chronic abdominal pain, rectal bleeding, constipation or diarrhea.   Patient is otherwise without complaints or active issues today.   Past Medical History: Past Medical History:  Diagnosis Date   Hypertension 08/07/2004   Sleep apnea    Snoring 03/05/2014     Past Surgical History: History reviewed. No pertinent surgical history.  Allergies: No Known Allergies  Outpatient Meds: Current Outpatient Medications  Medication Sig Dispense Refill   diltiazem (CARDIZEM CD) 240 MG 24 hr capsule SMARTSIG:1 Capsule(s) By Mouth Every Evening     olmesartan (BENICAR) 40 MG tablet Take 40 mg by mouth daily.     lisinopril-hydrochlorothiazide (PRINZIDE,ZESTORETIC) 20-25 MG per tablet Take 1 tablet by mouth daily. (Patient not taking: Reported on 02/13/2022)     Current Facility-Administered Medications  Medication Dose Route Frequency Provider Last Rate Last Admin   0.9 %  sodium chloride infusion  500 mL Intravenous Once Charlie Pitter III, MD          ___________________________________________________________________ Objective   Exam:  BP 138/68   Pulse 62   Temp 98.4 F (36.9 C) (Temporal)   Ht 5\' 8"  (1.727 m)   Wt 201 lb (91.2 kg)   SpO2 97%   BMI 30.56 kg/m   CV: RRR without murmur, S1/S2 Resp: clear to auscultation bilaterally, normal RR and effort noted GI: soft, no tenderness, with active bowel sounds.   Assessment: Encounter Diagnosis  Name Primary?   Special screening for malignant neoplasms, colon Yes     Plan: Colonoscopy  The benefits and risks of the planned procedure were described in detail with the patient or (when appropriate) their health care proxy.  Risks were outlined as including, but not limited to, bleeding, infection,  perforation, adverse medication reaction leading to cardiac or pulmonary decompensation, pancreatitis (if ERCP).  The limitation of incomplete mucosal visualization was also discussed.  No guarantees or warranties were given.    The patient is appropriate for an endoscopic procedure in the ambulatory setting.   - , MD

## 2022-04-12 NOTE — Progress Notes (Signed)
There has been a change in his Bp medication dosage since his PV.

## 2022-04-12 NOTE — Patient Instructions (Signed)
Please read handouts provided. Continue present medications. Resume previous diet. Repeat colonoscopy in 1 year for screening.   YOU HAD AN ENDOSCOPIC PROCEDURE TODAY AT THE [facility]:   Refer to the procedure report that was given to you for any specific questions about what was found during the examination.  If the procedure report does not answer your questions, please call your gastroenterologist to clarify.  If you requested that your care partner not be given the details of your procedure findings, then the procedure report has been included in a sealed envelope for you to review at your convenience later.  YOU SHOULD EXPECT: Some feelings of bloating in the abdomen. Passage of more gas than usual.  Walking can help get rid of the air that was put into your GI tract during the procedure and reduce the bloating. If you had a lower endoscopy (such as a colonoscopy or flexible sigmoidoscopy) you may notice spotting of blood in your stool or on the toilet paper. If you underwent a bowel prep for your procedure, you may not have a normal bowel movement for a few days.  Please Note:  You might notice some irritation and congestion in your nose or some drainage.  This is from the oxygen used during your procedure.  There is no need for concern and it should clear up in a day or so.  SYMPTOMS TO REPORT IMMEDIATELY:  Following lower endoscopy (colonoscopy or flexible sigmoidoscopy):  Excessive amounts of blood in the stool  Significant tenderness or worsening of abdominal pains  Swelling of the abdomen that is new, acute  Fever of 100F or higher.  For urgent or emergent issues, a gastroenterologist can be reached at any hour by calling [phone]. Do not use MyChart messaging for urgent concerns.    DIET:  We do recommend a small meal at first, but then you may proceed to your regular diet.  Drink plenty of fluids but you should avoid alcoholic beverages for 24  hours.  ACTIVITY:  You should plan to take it easy for the rest of today and you should NOT DRIVE or use heavy machinery until tomorrow (because of the sedation medicines used during the test).    FOLLOW UP: Our staff will call the number listed on your records the next business day following your procedure.  We will call around 7:15- 8:00 am to check on you and address any questions or concerns that you may have regarding the information given to you following your procedure. If we do not reach you, we will leave a message.  If you develop any symptoms (ie: fever, flu-like symptoms, shortness of breath, cough etc.) before then, please call [phone].  If you test positive for Covid 19 in the 2 weeks post procedure, please call and report this information to [place].    If any biopsies were taken you will be contacted by phone or by letter within the next 1-3 weeks.  Please call us at [phone] if you have not heard about the biopsies in 3 weeks.    SIGNATURES/CONFIDENTIALITY: You and/or your care partner have signed paperwork which will be entered into your electronic medical record.  These signatures attest to the fact that that the information above on your After Visit Summary has been reviewed and is understood.  Full responsibility of the confidentiality of this discharge information lies with you and/or your care-partner.

## 2022-04-12 NOTE — Op Note (Signed)
[place] Patient Name: Dalton Baker Procedure Date: 04/12/2022 10:34 AM MRN: [id number] Endoscopist: Mallie Mussel L. Loletha Carrow , MD Age: 61 Referring MD:  Date of Birth: 1961-03-18 Gender: Male Account #: [id number] Procedure:                Colonoscopy Indications:              Screening for colorectal malignant neoplasm                           no polyps last colonoscopy July 2013 Medicines:                Monitored Anesthesia Care Procedure:                Pre-Anesthesia Assessment:                           - Prior to the procedure, a History and Physical                            was performed, and patient medications and                            allergies were reviewed. The patient's tolerance of                            previous anesthesia was also reviewed. The risks                            and benefits of the procedure and the sedation                            options and risks were discussed with the patient.                            All questions were answered, and informed consent                            was obtained. Prior Anticoagulants: The patient has                            taken no previous anticoagulant or antiplatelet                            agents. ASA Grade Assessment: II - A patient with                            mild systemic disease. After reviewing the risks                            and benefits, the patient was deemed in                            satisfactory condition to undergo the procedure.  After obtaining informed consent, the colonoscope                            was passed under direct vision. Throughout the                            procedure, the patient's blood pressure, pulse, and                            oxygen saturations were monitored continuously. The                            CF [id number] was introduced through the anus                            and advanced to the the  cecum, identified by                            appendiceal orifice and ileocecal valve. The                            colonoscopy was performed without difficulty. The                            patient tolerated the procedure well. The quality                            of the bowel preparation was fair despite lavage                            (adherent stool - see photos). The ileocecal valve,                            appendiceal orifice, and rectum were photographed.                            The bowel preparation used was SUPREP. Scope In: 10:42:02 AM Scope Out: 10:55:29 AM Scope Withdrawal Time: 0 hours 10 minutes 32 seconds  Total Procedure Duration: 0 hours 13 minutes 27 seconds  Findings:                 The perianal and digital rectal examinations were                            normal.                           A few diverticula were found in the left colon.                           Internal hemorrhoids were found.                           Repeat examination of right colon under NBI  performed.                           The exam was otherwise without abnormality on                            direct and retroflexion views. Complications:            No immediate complications. Estimated Blood Loss:     Estimated blood loss: none. Impression:               - Preparation of the colon was fair.                           - Diverticulosis in the left colon.                           - Internal hemorrhoids.                           - The examination was otherwise normal on direct                            and retroflexion views.                           - No specimens collected. Recommendation:           - Patient has a contact number available for                            emergencies. The signs and symptoms of potential                            delayed complications were discussed with the                            patient. Return to normal  activities tomorrow.                            Written discharge instructions were provided to the                            patient.                           - Resume previous diet.                           - Continue present medications.                           - Repeat colonoscopy in 1 year for screening                            purposes due to fair prep quality (see above).                            (  split-dose golytely for next exam) Cana Mignano L. Myrtie Neither, MD 04/12/2022 11:00:32 AM This report has been signed electronically.

## 2022-04-13 ENCOUNTER — Telehealth: Payer: Self-pay

## 2022-04-13 NOTE — Telephone Encounter (Signed)
Left message on follow up call. 

## 2022-07-05 ENCOUNTER — Ambulatory Visit (INDEPENDENT_AMBULATORY_CARE_PROVIDER_SITE_OTHER): Payer: PRIVATE HEALTH INSURANCE

## 2022-07-05 ENCOUNTER — Encounter: Payer: Self-pay | Admitting: Orthopedic Surgery

## 2022-07-05 ENCOUNTER — Ambulatory Visit (INDEPENDENT_AMBULATORY_CARE_PROVIDER_SITE_OTHER): Payer: PRIVATE HEALTH INSURANCE | Admitting: Orthopedic Surgery

## 2022-07-05 VITALS — Ht 68.0 in | Wt 201.0 lb

## 2022-07-05 DIAGNOSIS — M170 Bilateral primary osteoarthritis of knee: Secondary | ICD-10-CM

## 2022-07-05 DIAGNOSIS — M1711 Unilateral primary osteoarthritis, right knee: Secondary | ICD-10-CM

## 2022-07-05 DIAGNOSIS — M25561 Pain in right knee: Secondary | ICD-10-CM

## 2022-07-05 NOTE — Progress Notes (Unsigned)
New Patient Visit  Assessment: Dalton Baker is a 61 y.o. male with the following: 1. Arthritis of right knee ***   Plan: Greer Ee    Procedure note injection Right knee joint   Verbal consent was obtained to inject the right knee joint  Timeout was completed to confirm the site of injection.  The skin was prepped with alcohol and ethyl chloride was sprayed at the injection site.  A 21-gauge needle was used to inject 40 mg of Depo-Medrol and 1% lidocaine (3 cc) into the right knee using an anterolateral approach.  There were no complications. A sterile bandage was applied.   Follow-up: No follow-ups on file.  Subjective:  Chief Complaint  Patient presents with   Knee Pain    Rt knee pain    History of Present Illness: Dalton Baker is a 61 y.o. male who {Presentation:27320} for evaluation of    Review of Systems: No fevers or chills*** No numbness or tingling No chest pain No shortness of breath No bowel or bladder dysfunction No GI distress No headaches   Medical History:  Past Medical History:  Diagnosis Date   Hypertension 08/07/2004   Sleep apnea    Snoring 03/05/2014    No past surgical history on file.  Family History  Problem Relation Age of Onset   Multiple sclerosis Mother    Colon cancer Neg Hx    Colon polyps Neg Hx    Esophageal cancer Neg Hx    Stomach cancer Neg Hx    Rectal cancer Neg Hx    Social History   Tobacco Use   Smoking status: Never   Smokeless tobacco: Never  Vaping Use   Vaping Use: Never used  Substance Use Topics   Alcohol use: No   Drug use: No    No Known Allergies  No outpatient medications have been marked as taking for the 07/05/22 encounter (Procedure visit) with Oliver Barre, MD.    Objective: Ht 5\' 8"  (1.727 m)   Wt 201 lb (91.2 kg)   BMI 30.56 kg/m   Physical Exam:  General: {General PE Findings:25791} Gait: {Gait:25792}    IMAGING: I personally ordered and  reviewed the following images  X-rays of the right knee, with views of the left knee were obtained in clinic today.  Overall, mild varus alignment bilaterally.  In the right knee, there is mild loss of joint space within the medial compartment.  Small osteophytes are appreciated.  Limited views of the left knee demonstrates similar findings, with slightly more advanced loss of joint space within the medial compartment.  Small osteophytes are noted on the patella.  No acute injuries.  Impression: Mild to moderate bilateral knee arthritis, most prominent within the medial compartment   New Medications:  No orders of the defined types were placed in this encounter.     , MD  07/05/2022 10:03 AM

## 2022-07-06 ENCOUNTER — Encounter: Payer: Self-pay | Admitting: Orthopedic Surgery

## 2023-02-15 ENCOUNTER — Encounter: Payer: Self-pay | Admitting: Gastroenterology

## 2024-07-12 ENCOUNTER — Emergency Department (HOSPITAL_COMMUNITY)
Admission: EM | Admit: 2024-07-12 | Discharge: 2024-07-12 | Disposition: A | Discharge status: Home / Self Care | Attending: Emergency Medicine | Admitting: Emergency Medicine

## 2024-07-12 ENCOUNTER — Encounter (HOSPITAL_COMMUNITY): Payer: Self-pay

## 2024-07-12 ENCOUNTER — Other Ambulatory Visit: Payer: Self-pay

## 2024-07-12 ENCOUNTER — Emergency Department (HOSPITAL_COMMUNITY)

## 2024-07-12 DIAGNOSIS — R739 Hyperglycemia, unspecified: Secondary | ICD-10-CM

## 2024-07-12 DIAGNOSIS — I10 Essential (primary) hypertension: Secondary | ICD-10-CM | POA: Insufficient documentation

## 2024-07-12 DIAGNOSIS — E1165 Type 2 diabetes mellitus with hyperglycemia: Secondary | ICD-10-CM | POA: Insufficient documentation

## 2024-07-12 DIAGNOSIS — N3090 Cystitis, unspecified without hematuria: Secondary | ICD-10-CM | POA: Insufficient documentation

## 2024-07-12 DIAGNOSIS — Z79899 Other long term (current) drug therapy: Secondary | ICD-10-CM | POA: Insufficient documentation

## 2024-07-12 LAB — CBC WITH DIFFERENTIAL/PLATELET
Abs Immature Granulocytes: 0.02 K/uL (ref 0.00–0.07)
Basophils Absolute: 0 K/uL (ref 0.0–0.1)
Basophils Relative: 0 %
Eosinophils Absolute: 0 K/uL (ref 0.0–0.5)
Eosinophils Relative: 1 %
HCT: 44.7 % (ref 39.0–52.0)
Hemoglobin: 15 g/dL (ref 13.0–17.0)
Immature Granulocytes: 0 %
Lymphocytes Relative: 14 %
Lymphs Abs: 1 K/uL (ref 0.7–4.0)
MCH: 30.6 pg (ref 26.0–34.0)
MCHC: 33.6 g/dL (ref 30.0–36.0)
MCV: 91.2 fL (ref 80.0–100.0)
Monocytes Absolute: 0.6 K/uL (ref 0.1–1.0)
Monocytes Relative: 8 %
Neutro Abs: 5.3 K/uL (ref 1.7–7.7)
Neutrophils Relative %: 77 %
Platelets: 280 K/uL (ref 150–400)
RBC: 4.9 MIL/uL (ref 4.22–5.81)
RDW: 14.9 % (ref 11.5–15.5)
WBC: 7 K/uL (ref 4.0–10.5)
nRBC: 0 % (ref 0.0–0.2)

## 2024-07-12 LAB — BASIC METABOLIC PANEL WITH GFR
Anion gap: 15 (ref 5–15)
BUN: 22 mg/dL (ref 8–23)
CO2: 21 mmol/L — ABNORMAL LOW (ref 22–32)
Calcium: 9.6 mg/dL (ref 8.9–10.3)
Chloride: 103 mmol/L (ref 98–111)
Creatinine, Ser: 1.14 mg/dL (ref 0.61–1.24)
GFR, Estimated: >60 mL/min (ref >60)
Glucose, Bld: 146 mg/dL — ABNORMAL HIGH (ref 70–99)
Potassium: 3.1 mmol/L — ABNORMAL LOW (ref 3.5–5.1)
Sodium: 138 mmol/L (ref 135–145)

## 2024-07-12 LAB — URINALYSIS, ROUTINE W REFLEX MICROSCOPIC
Bilirubin Urine: NEGATIVE
Glucose, UA: NEGATIVE mg/dL
Hgb urine dipstick: NEGATIVE
Ketones, ur: NEGATIVE mg/dL
Nitrite: POSITIVE — AB
Protein, ur: 30 mg/dL — AB
Specific Gravity, Urine: 1.02 (ref 1.005–1.030)
pH: 5 (ref 5.0–8.0)

## 2024-07-12 LAB — BLOOD GAS, VENOUS
Acid-Base Excess: 3 mmol/L — ABNORMAL HIGH (ref 0.0–2.0)
Bicarbonate: 28.5 mmol/L — ABNORMAL HIGH (ref 20.0–28.0)
Drawn by: 4237
O2 Saturation: 70.6 %
Patient temperature: 36.8
pCO2, Ven: 46 mmHg (ref 44–60)
pH, Ven: 7.4 (ref 7.25–7.43)
pO2, Ven: 41 mmHg (ref 32–45)

## 2024-07-12 LAB — CBG MONITORING, ED: Glucose-Capillary: 197 mg/dL — ABNORMAL HIGH (ref 70–99)

## 2024-07-12 MED ORDER — CEFPODOXIME PROXETIL 200 MG PO TABS: 200.0000 mg | ORAL_TABLET | Freq: Two times a day (BID) | ORAL | 0 refills | Status: AC

## 2024-07-12 MED ORDER — CEPHALEXIN 500 MG PO CAPS
500.0000 mg | ORAL_CAPSULE | Freq: Once | ORAL | Status: AC
  Administered 2024-07-12: 500 mg via ORAL
  Filled 2024-07-12: qty 1

## 2024-07-12 MED ORDER — POTASSIUM CHLORIDE CRYS ER 20 MEQ PO TBCR
40.0000 meq | EXTENDED_RELEASE_TABLET | Freq: Once | ORAL | Status: AC
  Administered 2024-07-12: 40 meq via ORAL
  Filled 2024-07-12: qty 2

## 2024-07-12 MED ORDER — LACTATED RINGERS IV BOLUS
1000.0000 mL | Freq: Once | INTRAVENOUS | Status: AC
  Administered 2024-07-12: 1000 mL via INTRAVENOUS

## 2024-07-12 NOTE — ED Provider Notes (Signed)
  EMERGENCY DEPARTMENT AT [facility] Provider Note   CSN: [id number] Arrival date & time: 07/12/24  1556     Patient presents with: Hyperglycemia   Dalton Baker is a 63 y.o. male.   HPI Patient presents for hyperglycemia.  Medical history includes HTN, sleep apnea, arthritis.  He states that he has had diabetes in the past.  He was never started on any diabetic medications.  He adopted a low glycemic diet and has had his A1c levels come down.  He was told that he was no longer diabetic.  He has not had any recent symptoms of concern.  He continues to work at the [facility].  There was a right that broke out there a day.  He denies any symptoms or suspected injury from that episode.  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  When they checked his CBG it was in the range of 350.  Although patient does typically maintain a low glycemic diet, he does state that he had caramel sweets, barbecue corn chips, and a cough drop.  Cough drop was taken shortly prior to check of CBG.    Prior to Admission medications   Medication Sig Start Date End Date Taking? Authorizing Provider  cefpodoxime  (VANTIN ) 200 MG tablet Take 1 tablet (200 mg total) by mouth 2 (two) times daily for 10 days. 07/12/24 07/22/24 Yes Melvenia Motto, MD  diltiazem (CARDIZEM CD) 240 MG 24 hr capsule SMARTSIG:1 Capsule(s) By Mouth Every Evening 08/24/21   [provider]  lisinopril-hydrochlorothiazide (PRINZIDE,ZESTORETIC) 20-25 MG per tablet Take 1 tablet by mouth daily. Patient not taking: Reported on 02/13/2022    [provider]  olmesartan (BENICAR) 40 MG tablet Take 40 mg by mouth daily. 02/09/22   [provider]    Allergies: Patient has no known allergies.    Review of Systems  Constitutional:  Negative for fatigue.  Respiratory:  Positive for cough. Negative for shortness of breath.   Cardiovascular:  Negative for chest pain.   Endocrine: Negative for polydipsia and polyuria.  All other systems reviewed and are negative.   Updated Vital Signs BP (!) 165/97   Pulse 95   Temp 98.7 F (37.1 C) (Oral)   Resp 19   Ht 5' 8 (1.727 m)   SpO2 95%   BMI 30.56 kg/m   Physical Exam Vitals and nursing note reviewed.  Constitutional:      General: He is not in acute distress.    Appearance: Normal appearance. He is well-developed. He is not ill-appearing, toxic-appearing or diaphoretic.  HENT:     Head: Normocephalic and atraumatic.     Right Ear: External ear normal.     Left Ear: External ear normal.     Nose: Nose normal.     Mouth/Throat:     Mouth: Mucous membranes are moist.  Eyes:     Extraocular Movements: Extraocular movements intact.     Conjunctiva/sclera: Conjunctivae normal.  Cardiovascular:     Rate and Rhythm: Normal rate and regular rhythm.     Heart sounds: No murmur heard. Pulmonary:     Effort: Pulmonary effort is normal. No respiratory distress.     Breath sounds: No wheezing or rales.  Chest:     Chest wall: No tenderness.  Abdominal:     General: There is no distension.     Palpations: Abdomen is soft.     Tenderness: There is no abdominal tenderness.  Musculoskeletal:  General: No swelling. Normal range of motion.     Cervical back: Normal range of motion and neck supple.  Skin:    General: Skin is warm and dry.     Coloration: Skin is not jaundiced or pale.  Neurological:     General: No focal deficit present.     Mental Status: He is alert and oriented to person, place, and time.  Psychiatric:        Mood and Affect: Mood normal.        Behavior: Behavior normal.     (all labs ordered are listed, but only abnormal results are displayed) Labs Reviewed  BASIC METABOLIC PANEL WITH GFR - Abnormal; Notable for the following components:      Result Value   Potassium 3.1 (*)    CO2 21 (*)    Glucose, Bld 146 (*)    All other components within normal limits   URINALYSIS, ROUTINE W REFLEX MICROSCOPIC - Abnormal; Notable for the following components:   APPearance HAZY (*)    Protein, ur 30 (*)    Nitrite POSITIVE (*)    Leukocytes,Ua MODERATE (*)    Bacteria, UA MANY (*)    All other components within normal limits  BLOOD GAS, VENOUS - Abnormal; Notable for the following components:   Bicarbonate 28.5 (*)    Acid-Base Excess 3.0 (*)    All other components within normal limits  CBG MONITORING, ED - Abnormal; Notable for the following components:   Glucose-Capillary 197 (*)    All other components within normal limits  URINE CULTURE  CBC WITH DIFFERENTIAL/PLATELET  HEMOGLOBIN A1C    EKG: None  Radiology: No results found.   Procedures   Medications Ordered in the ED  potassium chloride  SA (KLOR-CON  M) CR tablet 40 mEq (has no administration in time range)  cephALEXin  (KEFLEX ) capsule 500 mg (has no administration in time range)  lactated ringers  bolus 1,000 mL (1,000 mLs Intravenous New Bag/Given 07/12/24 1629)                                    Medical Decision Making Amount and/or Complexity of Data Reviewed Labs: ordered. Radiology: ordered.  Risk Prescription drug management.   Patient presents for incidental finding of elevated blood sugar.  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  He denies any suspected injury from that incident.  His vital signs on arrival are notable for hypertension.  He is well-appearing on exam.  CBG at the facility was 357.  CBG in the ED has gone down to the range of 190.  He does state that he has been eating high glycemic foods today.  He did have a cough drop prior to them checking his blood sugar.  Will check A1c.  Patient to follow-up on results.  Lab work today shows continued downtrend of his blood sugar, normal kidney function, hypokalemia with otherwise normal electrolytes.  Replacement potassium was ordered.   Patient's urinalysis does show evidence of UTI.  Antibiotics were prescribed.  Patient was discharged in stable condition.     Final diagnoses:  Hyperglycemia  Cystitis    ED Discharge Orders          Ordered    cefpodoxime  (VANTIN ) 200 MG tablet  2 times daily        07/12/24 1736  Melvenia Motto, MD 07/12/24 [phone]

## 2024-07-12 NOTE — Discharge Instructions (Signed)
 A prescription for antibiotics was sent to your pharmacy for treatment of UTI.  Results of your A1c should be available tomorrow.  Follow-up with your primary care doctor to discuss results of A1c and possible need for medications for blood sugar control.

## 2024-07-12 NOTE — ED Triage Notes (Signed)
 Pt BIB EMS from work. Per EMS they wanted pt to get check because his CBG was 357. Pt stated he is prediabetic.

## 2024-07-13 LAB — HEMOGLOBIN A1C
Hgb A1c MFr Bld: 6.1 % — ABNORMAL HIGH (ref 4.8–5.6)
Mean Plasma Glucose: 128.37 mg/dL

## 2024-07-15 LAB — URINE CULTURE: Culture: >=100000 — AB

## 2024-07-16 ENCOUNTER — Telehealth (HOSPITAL_BASED_OUTPATIENT_CLINIC_OR_DEPARTMENT_OTHER): Payer: Self-pay

## 2024-07-16 NOTE — Telephone Encounter (Signed)
 Post ED Visit - Positive Culture Follow-up  Culture report reviewed by antimicrobial stewardship pharmacist: [facility] [x]  Leonor Bash, [place].D. []  Venetia Gully, Pharm.D., BCPS AQ-ID []  Garrel Crews, Pharm.D., BCPS []  Almarie Lunger, Pharm.D., BCPS []  [place], [address].D., BCPS, AAHIVP []  Rosaline Bihari, Pharm.D., BCPS, AAHIVP []  Vernell Meier, PharmD, BCPS []  Latanya Hint, PharmD, BCPS []  Donald Medley, PharmD, BCPS []  Rocky Bold, PharmD []  Dorothyann Alert, PharmD, BCPS []  Morene Babe, PharmD  [facility] []  Rosaline Edison, PharmD []  Romona Bliss, PharmD []  Dolphus Roller, PharmD []  Veva Seip, Rph []  Vernell Daunt) Leonce, PharmD []  Eva Allis, PharmD []  Rosaline Millet, PharmD []  Iantha Batch, PharmD []  Arvin Gauss, PharmD []  Wanda Hasting, PharmD []  Ronal Rav, PharmD []  Rocky Slade, PharmD []  Bard Jeans, PharmD   Positive urine culture Treated with Cefpodoxime , organism sensitive to the same and no further patient follow-up is required at this time.  Ruth Camelia Elbe 07/16/2024, 9:31 AM
